# Patient Record
Sex: Female | Born: 1990 | Race: White | Hispanic: No | Marital: Single | Smoking: Current every day smoker
Health system: Southern US, Community
[De-identification: ages and names within clinical notes are randomized; demographics above are authoritative.]

## PROBLEM LIST (undated history)

## (undated) DIAGNOSIS — R51 Headache: Secondary | ICD-10-CM

## (undated) DIAGNOSIS — R519 Headache, unspecified: Secondary | ICD-10-CM

## (undated) HISTORY — DX: Headache, unspecified: R51.9

## (undated) HISTORY — PX: NO PAST SURGERIES: SHX2092

## (undated) HISTORY — DX: Headache: R51

---

## 2009-08-13 ENCOUNTER — Emergency Department: Payer: Self-pay | Admitting: Emergency Medicine

## 2010-03-17 ENCOUNTER — Emergency Department: Payer: Self-pay | Admitting: Emergency Medicine

## 2013-03-05 ENCOUNTER — Emergency Department: Payer: Self-pay | Admitting: Emergency Medicine

## 2015-12-31 ENCOUNTER — Ambulatory Visit (INDEPENDENT_AMBULATORY_CARE_PROVIDER_SITE_OTHER): Payer: BLUE CROSS/BLUE SHIELD | Admitting: Family Medicine

## 2015-12-31 ENCOUNTER — Encounter: Payer: Self-pay | Admitting: Family Medicine

## 2015-12-31 VITALS — BP 98/64 | HR 99 | Temp 98.2°F | Ht <= 58 in | Wt 90.8 lb

## 2015-12-31 DIAGNOSIS — R42 Dizziness and giddiness: Secondary | ICD-10-CM

## 2015-12-31 DIAGNOSIS — Z13220 Encounter for screening for lipoid disorders: Secondary | ICD-10-CM

## 2015-12-31 MED ORDER — HYDROCHLOROTHIAZIDE 12.5 MG PO CAPS: 12.5000 mg | ORAL_CAPSULE | Freq: Every day | ORAL | Status: DC

## 2015-12-31 NOTE — Progress Notes (Signed)
Pre visit review using our clinic review tool, if applicable. No additional management support is needed unless otherwise documented below in the visit note. 

## 2015-12-31 NOTE — Patient Instructions (Signed)
Take the medication as prescribed.  Follow up in 2 weeks (or sooner if needed).  Take care  Dr. Adriana Simasook   Meniere Disease Meniere disease is an inner ear disorder. It causes attacks of a spinning sensation (vertigo) and ringing in the ear (tinnitus). It also causes hearing loss and a sensation of fullness or pressure in your ear.  Meniere disease is lifelong. It may get worse over time. Symptoms usually begin in one ear but may eventually affect both ears.  CAUSES Meniere disease is caused by having too much of the fluid that is in your inner ear (endolymph). When endolymph builds up in your inner ear, it affects the nerves that control balance and hearing. The reason for the endolymph buildup is not known. Possible causes include:  Allergy.  An abnormal reaction of the body's defense system (autoimmune disease).  Viral infection of the inner ear.  Head injury. RISK FACTORS  Age older than 40 years.  Family history of Meniere disease.  History of autoimmune disease.  History of migraine headaches. SIGNS AND SYMPTOMS Symptoms of Meniere disease can come and go and may last for up to 4 hours at a time. Symptoms usually start in one ear and may become more frequent and eventually involve both ears. Symptoms can include:  Fullness and pressure in your ear.  Roaring or ringing in your ear.  Vertigo and loss of balance.  Decreased hearing.  Nausea and vomiting. DIAGNOSIS Your health care provider will perform a physical exam. Tests may be done to confirm a diagnosis of Meniere disease. These tests may include:  A hearing test (audiogram).  An electronystagmogram. This tests your balance nerve (vestibular nerve).  Imaging studies, such as CT or MRI, of your inner ear. TREATMENT There is no cure for Meniere disease, but it can be managed. Management may include:  A diet that may help relieve symptoms of Meniere disease.  Use of medicines to  reduce:  Vertigo.  Nausea.  Fluid retention.  Use of an air pressure pulse generator. This is a machine that sends small pressure pulses into your ear canal.  Inner ear surgery (rare). When you experience symptoms, it can be helpful to lie down on a flat surface and focus your eyes on one object that does not move. Try to stay in that position until your symptoms go away.  HOME CARE INSTRUCTIONS   Take medicines only as directed by your health care provider.  Eat the same amount of food at the same time every day, including snacks.  Do not skip meals.  Limit the salt in your diet to 1,000 mg a day.  Avoid caffeine.  Limit alcoholic drinks to one drink a day.  Do not eat foods containing monosodium glutamate (MSG).  Drink enough fluids to keep your urine clear or pale yellow.  Do not use any tobacco products including cigarettes, chewing tobacco, or electronic cigarettes. If you need help quitting, ask your health care provider.  Find ways to reduce or avoid stress. SEEK MEDICAL CARE IF:   You have symptoms that last longer than 4 hours.  You have new or more severe symptoms. SEEK IMMEDIATE MEDICAL CARE IF:   You have been vomiting for 24 hours.  You are not able to keep fluids down.  You have chest pain or trouble breathing.   This information is not intended to replace advice given to you by your health care provider. Make sure you discuss any questions you have with your health  care provider.   Document Released: 06/11/2000 Document Revised: 07/05/2014 Document Reviewed: 05/28/2013 Elsevier Interactive Patient Education [employer]2016 Elsevier Inc.

## 2016-01-01 ENCOUNTER — Encounter: Payer: Self-pay | Admitting: Family Medicine

## 2016-01-01 DIAGNOSIS — R42 Dizziness and giddiness: Secondary | ICD-10-CM | POA: Insufficient documentation

## 2016-01-01 LAB — LIPID PANEL
CHOLESTEROL: 131 mg/dL (ref 0–200)
HDL: 48.7 mg/dL (ref >39.00)
LDL CALC: 70 mg/dL (ref 0–99)
NonHDL: 82.18
TRIGLYCERIDES: 59 mg/dL (ref 0.0–149.0)
Total CHOL/HDL Ratio: 3
VLDL: 11.8 mg/dL (ref 0.0–40.0)

## 2016-01-01 LAB — COMPREHENSIVE METABOLIC PANEL
ALT: 9 U/L (ref 0–35)
AST: 17 U/L (ref 0–37)
Albumin: 4.6 g/dL (ref 3.5–5.2)
Alkaline Phosphatase: 63 U/L (ref 39–117)
BUN: 17 mg/dL (ref 6–23)
CALCIUM: 9.6 mg/dL (ref 8.4–10.5)
CHLORIDE: 106 meq/L (ref 96–112)
CO2: 24 meq/L (ref 19–32)
Creatinine, Ser: 0.75 mg/dL (ref 0.40–1.20)
GFR: 99.06 mL/min (ref >60.00)
Glucose, Bld: 85 mg/dL (ref 70–99)
Potassium: 4.2 mEq/L (ref 3.5–5.1)
Sodium: 139 mEq/L (ref 135–145)
Total Bilirubin: 0.6 mg/dL (ref 0.2–1.2)
Total Protein: 7 g/dL (ref 6.0–8.3)

## 2016-01-01 LAB — CBC
HEMATOCRIT: 41.1 % (ref 36.0–46.0)
HEMOGLOBIN: 13.8 g/dL (ref 12.0–15.0)
MCHC: 33.5 g/dL (ref 30.0–36.0)
MCV: 96 fl (ref 78.0–100.0)
PLATELETS: 402 10*3/uL — AB (ref 150.0–400.0)
RBC: 4.28 Mil/uL (ref 3.87–5.11)
RDW: 12.4 % (ref 11.5–15.5)
WBC: 8.4 10*3/uL (ref 4.0–10.5)

## 2016-01-01 LAB — TSH: TSH: 1.21 u[IU]/mL (ref 0.35–4.50)

## 2016-01-01 NOTE — Assessment & Plan Note (Signed)
New problem. History consistent with Meniere's. Treating with HCTZ. Patient cautioned as her BP is already on the low side. Follow up in 2 weeks.  If no improvement will send to ENT.

## 2016-01-01 NOTE — Progress Notes (Signed)
Subjective:  Patient ID: Rebecca Benson, female    DOB: [birth year]  Age: 25 y.o. MRN: [id number]  CC: ? Vertigo  HPI Rebecca Benson is a 25 y.o. female presents to the clinic today as a new patient with the above complaint.  Vertigo  Patient reports she has had dizziness for 2-3 years.  She states that she has seen 2 providers previously for this and was told she had vertigo.  She states that her dizziness happens in the mid to end of the day.  She describes it as feeling off balance/unsteady.  Reports spinning.  Lasts for 30-45 minutes then resolved spontaneously.   She reports that it happens frequently.  She reports associated ear fullness and tinnitus.  She has been treated with meclizine and scopolamine without significant improvement.  PMH, Surgical Hx, Family Hx, Social History reviewed and updated as below.  Past Medical History  Diagnosis Date  . Headache    Past Surgical History  Procedure Laterality Date  . No past surgeries     Family History  Problem Relation Age of Onset  . Hypertension Maternal Grandmother     Social History  Substance Use Topics  . Smoking status: Current Every Day Smoker -- 1.00 packs/day    Types: Cigarettes  . Smokeless tobacco: Never Used  . Alcohol Use: No   Review of Systems  HENT: Positive for tinnitus.   Respiratory: Positive for cough.   Neurological: Positive for dizziness and headaches.  Psychiatric/Behavioral: The patient is nervous/anxious.        Stress.  All other systems reviewed and are negative.  Objective:   Today's Vitals: BP 98/64 mmHg  Pulse 99  Temp(Src) 98.2 F (36.8 C) (Oral)  Ht 4' 8.75" (1.441 m)  Wt 90 lb 12 oz (41.164 kg)  BMI 19.82 kg/m2  SpO2 99%  Physical Exam  Constitutional: She is oriented to person, place, and time.  Thin petite female in NAD.   HENT:  Head: Normocephalic and atraumatic.  Mouth/Throat: Oropharynx is clear and moist.  Eyes: Conjunctivae are  normal. No scleral icterus.  Neck: Neck supple.  Cardiovascular: Normal rate and regular rhythm.   Pulmonary/Chest: Effort normal. She has no wheezes. She has no rales.  Abdominal: Soft. She exhibits no distension. There is no tenderness. There is no rebound and no guarding.  Musculoskeletal: Normal range of motion. She exhibits no edema.  Neurological: She is alert and oriented to person, place, and time.  Skin: Skin is warm and dry. No rash noted.  Psychiatric: She has a normal mood and affect.  Vitals reviewed.  Assessment & Plan:   Problem List Items Addressed This Visit    Vertigo - Primary    New problem. History consistent with Meniere's. Treating with HCTZ. Patient cautioned as her BP is already on the low side. Follow up in 2 weeks.  If no improvement will send to ENT.       Relevant Orders   CBC (Completed)   Comp Met (CMET) (Completed)   TSH (Completed)    Other Visit Diagnoses    Screening, lipid        Relevant Orders    Lipid Profile (Completed)       Outpatient Encounter Prescriptions as of 12/31/2015  Medication Sig  . [DISCONTINUED] ibuprofen (ADVIL,MOTRIN) 200 MG tablet Take 200 mg by mouth every 6 (six) hours as needed.  . hydrochlorothiazide (MICROZIDE) 12.5 MG capsule Take 1 capsule (12.5 mg total) by mouth daily.  No facility-administered encounter medications on file as of 12/31/2015.    Follow-up: 2 weeks.  Rebecca Benson

## 2016-01-05 ENCOUNTER — Telehealth: Payer: Self-pay | Admitting: *Deleted

## 2016-01-05 ENCOUNTER — Ambulatory Visit: Payer: BLUE CROSS/BLUE SHIELD | Admitting: Family Medicine

## 2016-01-05 NOTE — Telephone Encounter (Signed)
Called patient and reviewed labs, labs were sent via mail last week to patient.

## 2016-01-05 NOTE — Telephone Encounter (Signed)
Pt has requested lab results from 12-31-15 Pt contact [phone]

## 2016-01-08 ENCOUNTER — Ambulatory Visit (INDEPENDENT_AMBULATORY_CARE_PROVIDER_SITE_OTHER): Payer: BLUE CROSS/BLUE SHIELD | Admitting: Family Medicine

## 2016-01-08 ENCOUNTER — Encounter: Payer: Self-pay | Admitting: Family Medicine

## 2016-01-08 DIAGNOSIS — M545 Low back pain: Secondary | ICD-10-CM

## 2016-01-08 DIAGNOSIS — R109 Unspecified abdominal pain: Secondary | ICD-10-CM

## 2016-01-08 LAB — POCT URINALYSIS DIPSTICK
BILIRUBIN UA: NEGATIVE
Blood, UA: NEGATIVE
Glucose, UA: NEGATIVE
KETONES UA: NEGATIVE
LEUKOCYTES UA: NEGATIVE
Nitrite, UA: NEGATIVE
Spec Grav, UA: 1.015
Urobilinogen, UA: 2
pH, UA: 6.5

## 2016-01-08 NOTE — Progress Notes (Signed)
Pre visit review using our clinic review tool, if applicable. No additional management support is needed unless otherwise documented below in the visit note. 

## 2016-01-08 NOTE — Patient Instructions (Signed)
If this persists we will do the US.  Use OTC Tylenol/ibuprofen for pain/discomfort.  Call with concerns.  Take care  Dr. Adriana Simasook

## 2016-01-09 DIAGNOSIS — R109 Unspecified abdominal pain: Secondary | ICD-10-CM | POA: Insufficient documentation

## 2016-01-09 LAB — CBC
HEMATOCRIT: 42.6 % (ref 36.0–46.0)
HEMOGLOBIN: 14.5 g/dL (ref 12.0–15.0)
MCHC: 34.1 g/dL (ref 30.0–36.0)
MCV: 95.6 fl (ref 78.0–100.0)
PLATELETS: 433 10*3/uL — AB (ref 150.0–400.0)
RBC: 4.45 Mil/uL (ref 3.87–5.11)
RDW: 11.9 % (ref 11.5–15.5)
WBC: 10.7 10*3/uL — ABNORMAL HIGH (ref 4.0–10.5)

## 2016-01-09 LAB — COMPREHENSIVE METABOLIC PANEL
ALK PHOS: 58 U/L (ref 39–117)
ALT: 8 U/L (ref 0–35)
AST: 14 U/L (ref 0–37)
Albumin: 4.6 g/dL (ref 3.5–5.2)
BUN: 14 mg/dL (ref 6–23)
CHLORIDE: 103 meq/L (ref 96–112)
CO2: 26 meq/L (ref 19–32)
Calcium: 9.9 mg/dL (ref 8.4–10.5)
Creatinine, Ser: 0.81 mg/dL (ref 0.40–1.20)
GFR: 90.63 mL/min (ref >60.00)
GLUCOSE: 91 mg/dL (ref 70–99)
POTASSIUM: 3.9 meq/L (ref 3.5–5.1)
SODIUM: 139 meq/L (ref 135–145)
Total Bilirubin: 1.5 mg/dL — ABNORMAL HIGH (ref 0.2–1.2)
Total Protein: 7.3 g/dL (ref 6.0–8.3)

## 2016-01-09 NOTE — Assessment & Plan Note (Signed)
New problem. Unclear etiology and prognosis. Patient has lost nearly 4 lbs since our last visit on 7/5.  Vague symptoms and exam unrevealing. CBC and CMP today. May need imaging if persists.

## 2016-01-09 NOTE — Progress Notes (Signed)
Subjective:  Patient ID: Rebecca Benson, female    DOB: Oct 07, 1990  Age: 25 y.o. MRN: [id number]  CC: L flank pain  HPI:  25 year old female presents with the above complaint.  Patient states that she's not been feeling well since our last visit. I started her on HCTZ for concern for many years. She did not tolerate. Yesterday, she developed left flank/left upper quadrant pain. Patient states that the pain was initially severe and has now gotten somewhat better. Initially the pain was sharp and is now a "pressure" sensation. No associated nausea or vomiting. Patient does have poor by mouth intake. No reports of constipation. No known exacerbating or relieving factors. Additionally, patient states that she's felt "shaky". No recent fever or chills. Patient has an upcoming appointment with ENT given her prolonged or vertigo. No other complaints at this time.  Social Hx   Social History   Social History  . Marital Status: Single    Spouse Name: N/A  . Number of Children: N/A  . Years of Education: N/A   Social History Main Topics  . Smoking status: Current Every Day Smoker -- 1.00 packs/day    Types: Cigarettes  . Smokeless tobacco: Never Used  . Alcohol Use: No  . Drug Use: No  . Sexual Activity: Not Asked   Other Topics Concern  . None   Social History Narrative   Review of Systems  Constitutional: Negative for fever.       Has felt "shaky"  Genitourinary: Positive for flank pain. Negative for dysuria, urgency and frequency.   Objective:  BP 108/82 mmHg  Pulse 99  Temp(Src) 98.2 F (36.8 C) (Oral)  Wt 86 lb 7 oz (39.208 kg)  SpO2 98%  BP/Weight 10/28/8880 8/0/0349  Systolic BP 179 98  Diastolic BP 82 64  Wt. (Lbs) 86.44 90.75  BMI 18.88 19.82   Physical Exam  Constitutional: She is oriented to person, place, and time.  Appears fatigued but in no acute distress.  HENT:  Head: Normocephalic and atraumatic.  Eyes: Conjunctivae are normal. No scleral  icterus.  Cardiovascular: Regular rhythm.  Tachycardia present.   Pulmonary/Chest: Effort normal. She has no wheezes. She has no rales.  Abdominal: Soft. She exhibits no distension. There is no rebound and no guarding.  Patient with tenderness posteriorly around the ribs (left sided).   Neurological: She is alert and oriented to person, place, and time.  Vitals reviewed.  Lab Results  Component Value Date   WBC 8.4 12/31/2015   HGB 13.8 12/31/2015   HCT 41.1 12/31/2015   PLT 402.0* 12/31/2015   GLUCOSE 85 12/31/2015   CHOL 131 12/31/2015   TRIG 59.0 12/31/2015   HDL 48.70 12/31/2015   LDLCALC 70 12/31/2015   ALT 9 12/31/2015   AST 17 12/31/2015   NA 139 12/31/2015   K 4.2 12/31/2015   CL 106 12/31/2015   CREATININE 0.75 12/31/2015   BUN 17 12/31/2015   CO2 24 12/31/2015   TSH 1.21 12/31/2015   Results for orders placed or performed in visit on 01/08/16 (from the past 24 hour(s))  POCT Urinalysis Dipstick     Status: Normal   Collection Time: 01/08/16  4:08 PM  Result Value Ref Range   Color, UA orange    Clarity, UA clear    Glucose, UA neg    Bilirubin, UA neg    Ketones, UA neg    Spec Grav, UA 1.015    Blood, UA neg  pH, UA 6.5    Protein, UA 1+    Urobilinogen, UA 2.0    Nitrite, UA neg    Leukocytes, UA Negative Negative   Assessment & Plan:   Problem List Items Addressed This Visit    Flank pain    New problem. Unclear etiology and prognosis. Patient has lost nearly 4 lbs since our last visit on 7/5.  Vague symptoms and exam unrevealing. CBC and CMP today. May need imaging if persists.      Relevant Orders   POCT Urinalysis Dipstick (Completed)   Comp Met (CMET)   CBC     Follow-up: Pending labs and clinical improvement.  [place]

## 2016-01-13 ENCOUNTER — Telehealth: Payer: Self-pay | Admitting: *Deleted

## 2016-01-13 NOTE — Telephone Encounter (Signed)
LVM with info

## 2016-01-13 NOTE — Telephone Encounter (Signed)
Patient should call ENT office.

## 2016-01-13 NOTE — Telephone Encounter (Signed)
Pt questioned if she should go to her referred with ENT appt tomorrow, considering that she already has a appt with ENT the end of the month. She wanted to make sure this was not a new referral .  Pt contact [phone] A message can be left on the voicemail

## 2016-01-14 ENCOUNTER — Other Ambulatory Visit: Payer: Self-pay | Admitting: Family Medicine

## 2016-01-16 ENCOUNTER — Ambulatory Visit: Payer: BLUE CROSS/BLUE SHIELD | Admitting: Family Medicine

## 2016-01-16 DIAGNOSIS — Z0289 Encounter for other administrative examinations: Secondary | ICD-10-CM

## 2016-03-25 ENCOUNTER — Other Ambulatory Visit: Payer: Self-pay | Admitting: Neurology

## 2016-03-25 DIAGNOSIS — G43719 Chronic migraine without aura, intractable, without status migrainosus: Secondary | ICD-10-CM

## 2016-03-25 DIAGNOSIS — R42 Dizziness and giddiness: Secondary | ICD-10-CM

## 2016-04-01 ENCOUNTER — Ambulatory Visit
Admission: RE | Admit: 2016-04-01 | Discharge: 2016-04-01 | Disposition: A | Discharge status: Home / Self Care | Payer: BLUE CROSS/BLUE SHIELD | Source: Ambulatory Visit | Attending: Neurology | Admitting: Neurology

## 2016-04-01 DIAGNOSIS — G43719 Chronic migraine without aura, intractable, without status migrainosus: Secondary | ICD-10-CM | POA: Diagnosis not present

## 2016-04-01 DIAGNOSIS — R42 Dizziness and giddiness: Secondary | ICD-10-CM | POA: Insufficient documentation

## 2016-04-01 MED ORDER — GADOBENATE DIMEGLUMINE 529 MG/ML IV SOLN
10.0000 mL | Freq: Once | INTRAVENOUS | Status: AC | PRN
  Administered 2016-04-01: 7 mL via INTRAVENOUS

## 2016-07-26 ENCOUNTER — Telehealth: Payer: Self-pay | Admitting: Family Medicine

## 2016-07-26 NOTE — Telephone Encounter (Addendum)
Reason for call: chest pain    Symptoms: left breast sore, no numbness, tingling in left arm , denies SOB feeling like elephant sitting on chest, states this has happened before when on menstrual cycle.   On LMP since Saturday.  Duration since Saturday  Medication: Ibuprofen Patient will go to urgent care for evaluation or Er. Needs immediate evaluation today for cardiac workup.

## 2016-07-26 NOTE — Telephone Encounter (Signed)
Pt mom called and stated that pt is on her cycle and she has noticed that when she is on her cycle she has chest pain and no other symptoms. Please advise, thank you.  Call pt @ [phone]

## 2016-07-26 NOTE — Telephone Encounter (Signed)
Left message for patient to call to follow up to see if she seeked evaluation

## 2016-07-27 NOTE — Telephone Encounter (Signed)
Left voice mail message to call. ?

## 2016-08-09 NOTE — Telephone Encounter (Signed)
Patient never returned call  

## 2016-09-22 ENCOUNTER — Telehealth: Payer: Self-pay | Admitting: Family Medicine

## 2016-09-22 NOTE — Telephone Encounter (Signed)
There is no blood testing regarding this, unless she is referring to allergy testing (done by allergy)

## 2016-09-22 NOTE — Telephone Encounter (Signed)
Pt called and stated that she is having some mold remitation done in her house. Pt would like to know if we can run a blood test to see if it has effected her. Please advise, thank you!  Call pt @ [phone]

## 2016-09-23 NOTE — Telephone Encounter (Signed)
Patient advised of below and she will let us know if she [place] us to refer to allergist .  She will call us back and let us know.

## 2016-09-23 NOTE — Telephone Encounter (Signed)
Left message to call.

## 2016-09-27 ENCOUNTER — Other Ambulatory Visit: Payer: Self-pay | Admitting: Family Medicine

## 2016-09-27 DIAGNOSIS — T7840XA Allergy, unspecified, initial encounter: Secondary | ICD-10-CM

## 2016-09-27 NOTE — Telephone Encounter (Signed)
Pt called back dn stated that she would like a referral to Dr. Sampson Goon over at [facility]. Please advise, thank you!  Call pt @ [phone]

## 2016-09-27 NOTE — Telephone Encounter (Signed)
Patient stated that she would like to have a referral to the allergist  Pt contact [phone]

## 2016-10-06 ENCOUNTER — Ambulatory Visit (INDEPENDENT_AMBULATORY_CARE_PROVIDER_SITE_OTHER): Payer: BLUE CROSS/BLUE SHIELD

## 2016-10-06 ENCOUNTER — Encounter: Payer: Self-pay | Admitting: Family Medicine

## 2016-10-06 ENCOUNTER — Ambulatory Visit (INDEPENDENT_AMBULATORY_CARE_PROVIDER_SITE_OTHER): Payer: BLUE CROSS/BLUE SHIELD | Admitting: Family Medicine

## 2016-10-06 VITALS — BP 102/68 | HR 76 | Temp 97.7°F | Wt 83.5 lb

## 2016-10-06 DIAGNOSIS — R251 Tremor, unspecified: Secondary | ICD-10-CM | POA: Diagnosis not present

## 2016-10-06 DIAGNOSIS — R05 Cough: Secondary | ICD-10-CM | POA: Diagnosis not present

## 2016-10-06 DIAGNOSIS — R634 Abnormal weight loss: Secondary | ICD-10-CM

## 2016-10-06 DIAGNOSIS — N63 Unspecified lump in unspecified breast: Secondary | ICD-10-CM | POA: Diagnosis not present

## 2016-10-06 DIAGNOSIS — G43909 Migraine, unspecified, not intractable, without status migrainosus: Secondary | ICD-10-CM | POA: Insufficient documentation

## 2016-10-06 DIAGNOSIS — E538 Deficiency of other specified B group vitamins: Secondary | ICD-10-CM | POA: Insufficient documentation

## 2016-10-06 DIAGNOSIS — R059 Cough, unspecified: Secondary | ICD-10-CM

## 2016-10-06 LAB — CBC WITH DIFFERENTIAL/PLATELET
BASOS PCT: 0.6 % (ref 0.0–3.0)
Basophils Absolute: 0.1 10*3/uL (ref 0.0–0.1)
EOS ABS: 0.1 10*3/uL (ref 0.0–0.7)
Eosinophils Relative: 1.3 % (ref 0.0–5.0)
HCT: 44.2 % (ref 36.0–46.0)
HEMOGLOBIN: 14.8 g/dL (ref 12.0–15.0)
LYMPHS ABS: 2.1 10*3/uL (ref 0.7–4.0)
Lymphocytes Relative: 23.1 % (ref 12.0–46.0)
MCHC: 33.5 g/dL (ref 30.0–36.0)
MCV: 96.8 fl (ref 78.0–100.0)
MONO ABS: 0.9 10*3/uL (ref 0.1–1.0)
Monocytes Relative: 9.9 % (ref 3.0–12.0)
NEUTROS PCT: 65.1 % (ref 43.0–77.0)
Neutro Abs: 5.9 10*3/uL (ref 1.4–7.7)
PLATELETS: 430 10*3/uL — AB (ref 150.0–400.0)
RBC: 4.57 Mil/uL (ref 3.87–5.11)
RDW: 13.3 % (ref 11.5–15.5)
WBC: 9.1 10*3/uL (ref 4.0–10.5)

## 2016-10-06 LAB — TSH: TSH: 2.74 u[IU]/mL (ref 0.35–4.50)

## 2016-10-06 LAB — COMPREHENSIVE METABOLIC PANEL
ALBUMIN: 4.6 g/dL (ref 3.5–5.2)
ALT: 8 U/L (ref 0–35)
AST: 11 U/L (ref 0–37)
Alkaline Phosphatase: 70 U/L (ref 39–117)
BUN: 11 mg/dL (ref 6–23)
CO2: 24 mEq/L (ref 19–32)
Calcium: 9.7 mg/dL (ref 8.4–10.5)
Chloride: 105 mEq/L (ref 96–112)
Creatinine, Ser: 0.74 mg/dL (ref 0.40–1.20)
GFR: 100.02 mL/min (ref >60.00)
Glucose, Bld: 87 mg/dL (ref 70–99)
POTASSIUM: 4 meq/L (ref 3.5–5.1)
SODIUM: 137 meq/L (ref 135–145)
Total Bilirubin: 0.5 mg/dL (ref 0.2–1.2)
Total Protein: 6.9 g/dL (ref 6.0–8.3)

## 2016-10-06 LAB — HIV ANTIBODY (ROUTINE TESTING W REFLEX): HIV 1&2 Ab, 4th Generation: NONREACTIVE

## 2016-10-06 LAB — SEDIMENTATION RATE: Sed Rate: 2 mm/hr (ref 0–20)

## 2016-10-06 NOTE — Progress Notes (Signed)
Subjective:  Patient ID: Rebecca Benson, female    DOB: April 07, 1991  Age: 26 y.o. MRN: [id number]  CC: Multiple complaints  HPI:  26 year old female with a history of vertigo presents with multiple complaints. See below.  Patient states that she does not feel well. She continues to have ongoing vertigo. She has been seen by ENT and neurology. Neurology feels that her vertigo is related to migraine. Additionally, she has been found to have vitamin B12 deficiency. She is receiving supplementation. Her levels are now within normal limits. She states that she continues to have intermittent sharp pain in her ribs. She's had cough, congestion, and tremor. Neurology is following her regarding the migraine, vertigo, and tremor. Additionally, patient states that she has had a lump in her left breast since December. Nontender. She has not had any workup or evaluation regarding this.  Also, patient is experiencing weight loss. Unintentional. She states that she gains and then subsequently loses weight. She is down 7.25 pounds since January 22 2016. She states that she has intermittent periods where she has little appetite and experiences early satiety. He states that she has other periods of time where she "eats everything in sight". She denies vomiting. She does report some nausea. Normal bowel movements. No hematochezia or melena.  Social Hx   Social History   Social History  . Marital status: Single    Spouse name: N/A  . Number of children: N/A  . Years of education: N/A   Social History Main Topics  . Smoking status: Current Every Day Smoker    Packs/day: 1.00    Types: Cigarettes  . Smokeless tobacco: Never Used  . Alcohol use No  . Drug use: No  . Sexual activity: Not Asked   Other Topics Concern  . None   Social History Narrative  . None    Review of Systems  Constitutional: Positive for unexpected weight change.  HENT: Positive for congestion.   Respiratory: Positive for cough  and shortness of breath.   Gastrointestinal: Positive for nausea.  Neurological: Positive for dizziness and headaches.   Objective:  BP 102/68   Pulse 76   Temp 97.7 F (36.5 C) (Oral)   Wt 83 lb 8 oz (37.9 kg)   SpO2 98%   BMI 18.23 kg/m   BP/Weight 10/06/2016 01/08/2016 12/31/2015  Systolic BP 102 108 98  Diastolic BP 68 82 64  Wt. (Lbs) 83.5 86.44 90.75  BMI 18.23 18.88 19.82   Physical Exam  Constitutional: She is oriented to person, place, and time.  Thin female in no acute distress.  Cardiovascular: Normal rate.   Pulmonary/Chest: Effort normal and breath sounds normal.  Abdominal: Soft. She exhibits no distension. There is no tenderness.  Neurological: She is alert and oriented to person, place, and time.  Psychiatric: She has a normal mood and affect.  Vitals reviewed. Left breast - small, firm palpable mass at the 6:00 position 3 cm from the nipple. Nontender.  Lab Results  Component Value Date   WBC 10.7 (H) 01/08/2016   HGB 14.5 01/08/2016   HCT 42.6 01/08/2016   PLT 433.0 (H) 01/08/2016   GLUCOSE 91 01/08/2016   CHOL 131 12/31/2015   TRIG 59.0 12/31/2015   HDL 48.70 12/31/2015   LDLCALC 70 12/31/2015   ALT 8 01/08/2016   AST 14 01/08/2016   NA 139 01/08/2016   K 3.9 01/08/2016   CL 103 01/08/2016   CREATININE 0.81 01/08/2016   BUN 14 01/08/2016  CO2 26 01/08/2016   TSH 1.21 12/31/2015    Assessment & Plan:   Problem List Items Addressed This Visit    Unexplained weight loss - Primary    New Problem. Uncertain etiology and prognosis at this time. It is unclear to me the source of her issues regarding PO intake (periods of decreased appetite followed by large intake of food. No reports of binge eating or emesis). Proceeding with laboratory workup and chest x-ray.      Relevant Orders   Comprehensive metabolic panel   TSH   Sedimentation rate   HIV antibody (with reflex)   DG Chest 2 View (Completed)   Allergen Profile, Mold   CBC w/Diff    Tremor    Uncertain etiology. Followed by neurology.      Migraine    Follow by neurology.      Cough    New problem. Patient with reports of cough and subjective shortness of breath. She is concerned about mold causing her symptoms. She is having someone test for mold in her house. Wants to see allergy. Will arrange.      Breast mass in female    New problem. Arranging [place].      Relevant Orders   US BREAST LTD UNI RIGHT INC AXILLA   US BREAST LTD UNI LEFT INC AXILLA     Follow-up: 1 month  Rockelle Heuerman DO [facility]

## 2016-10-06 NOTE — Assessment & Plan Note (Addendum)
New Problem. Uncertain etiology and prognosis at this time. It is unclear to me the source of her issues regarding PO intake (periods of decreased appetite followed by large intake of food. No reports of binge eating or emesis). Proceeding with laboratory workup and chest x-ray.

## 2016-10-06 NOTE — Progress Notes (Signed)
Pre visit review using our clinic review tool, if applicable. No additional management support is needed unless otherwise documented below in the visit note. 

## 2016-10-06 NOTE — Patient Instructions (Signed)
Labs and xray today.  We will arrange the referral and the imaging.  Follow up in 1 month.  Take care  Dr. Adriana Simas

## 2016-10-06 NOTE — Assessment & Plan Note (Signed)
Follow by neurology.  

## 2016-10-06 NOTE — Assessment & Plan Note (Signed)
New problem. Patient with reports of cough and subjective shortness of breath. She is concerned about mold causing her symptoms. She is having someone test for mold in her house. Wants to see allergy. Will arrange.

## 2016-10-06 NOTE — Assessment & Plan Note (Signed)
Uncertain etiology. Followed by neurology.

## 2016-10-06 NOTE — Assessment & Plan Note (Signed)
New problem. Arranging [place].

## 2016-10-07 LAB — ALLERGEN PROFILE, MOLD
Allergen, A. alternata, m6: <0.1 kU/L
Allergen, C. Herbarum, M2: <0.1 kU/L
Allergen, P. notatum, m1: <0.1 kU/L

## 2016-10-18 ENCOUNTER — Ambulatory Visit: Payer: BLUE CROSS/BLUE SHIELD | Attending: Family Medicine

## 2016-11-08 ENCOUNTER — Ambulatory Visit: Payer: BLUE CROSS/BLUE SHIELD | Admitting: Family Medicine

## 2016-11-08 DIAGNOSIS — Z0289 Encounter for other administrative examinations: Secondary | ICD-10-CM

## 2016-11-09 ENCOUNTER — Other Ambulatory Visit: Payer: BLUE CROSS/BLUE SHIELD

## 2018-10-04 ENCOUNTER — Ambulatory Visit
Admission: EM | Admit: 2018-10-04 | Discharge: 2018-10-04 | Disposition: A | Discharge status: Home / Self Care | Payer: BLUE CROSS/BLUE SHIELD | Attending: Family Medicine | Admitting: Family Medicine

## 2018-10-04 ENCOUNTER — Other Ambulatory Visit: Payer: Self-pay

## 2018-10-04 ENCOUNTER — Encounter: Payer: Self-pay | Admitting: Emergency Medicine

## 2018-10-04 DIAGNOSIS — G44209 Tension-type headache, unspecified, not intractable: Secondary | ICD-10-CM

## 2018-10-04 MED ORDER — HYDROCODONE-ACETAMINOPHEN 5-325 MG PO TABS: ORAL_TABLET | ORAL | 0 refills | Status: AC

## 2018-10-04 NOTE — ED Triage Notes (Signed)
Pt c/o headache that feels like pressure on the sides of her head. Started about 4 days ago. She also has had tingling in her left thumb. She states that the pressure in her head was worse last night that ibuprofen did not help. Denies head trauma, blurred vision, or weakness.

## 2018-10-28 NOTE — ED Provider Notes (Signed)
[facility]    CSN: [id number] Arrival date & time: 10/04/18  0930     History   Chief Complaint Chief Complaint  Patient presents with  . Headache  . Numbness    HPI BILEN GOLE is a 28 y.o. female.   28 yo female with a c/o headache all around her head like a tight band for the past 4 days. Denies any vision changes, nausea, vomiting, photophobia, fevers, chills, injuries, falls.   The history is provided by the patient.  Headache    Past Medical History:  Diagnosis Date  . Headache     Patient Active Problem List   Diagnosis Date Noted  . B12 deficiency 10/06/2016  . Tremor 10/06/2016  . Migraine 10/06/2016  . Unexplained weight loss 10/06/2016  . Breast mass in female 10/06/2016  . Cough 10/06/2016  . Vertigo 01/01/2016    Past Surgical History:  Procedure Laterality Date  . NO PAST SURGERIES      OB History   No obstetric history on file.      Home Medications    Prior to Admission medications   Medication Sig Start Date End Date Taking? Authorizing Provider  HYDROcodone-acetaminophen (NORCO/VICODIN) 5-325 MG tablet 1-2 tablets bid prn 10/04/18   Payton Mccallum, MD    Family History Family History  Problem Relation Age of Onset  . Hypertension Maternal Grandmother   . Healthy Mother   . Healthy Father     Social History Social History   Tobacco Use  . Smoking status: Current Every Day Smoker    Packs/day: 1.00    Types: Cigarettes  . Smokeless tobacco: Never Used  Substance Use Topics  . Alcohol use: No    Alcohol/week: 0.0 standard drinks  . Drug use: No     Allergies   Sulfa antibiotics   Review of Systems Review of Systems  Neurological: Positive for headaches.     Physical Exam Triage Vital Signs ED Triage Vitals  Enc Vitals Group     BP 10/04/18 1002 113/76     Pulse Rate 10/04/18 1002 85     Resp 10/04/18 1002 18     Temp 10/04/18 1002 98.5 F (36.9 C)     Temp Source 10/04/18 1002 Oral      SpO2 10/04/18 1002 100 %     Weight 10/04/18 0959 91 lb (41.3 kg)     Height 10/04/18 0959 4\' 11"  (1.499 m)     Head Circumference --      Peak Flow --      Pain Score 10/04/18 0959 4     Pain Loc --      Pain Edu? --      Excl. in GC? --    No data found.  Updated Vital Signs BP 113/76 (BP Location: Left Arm)   Pulse 85   Temp 98.5 F (36.9 C) (Oral)   Resp 18   Ht 4\' 11"  (1.499 m)   Wt 41.3 kg   LMP 09/20/2018 (Approximate)   SpO2 100%   BMI 18.38 kg/m   Visual Acuity Right Eye Distance:   Left Eye Distance:   Bilateral Distance:    Right Eye Near:   Left Eye Near:    Bilateral Near:     Physical Exam Vitals signs and nursing note reviewed.  Constitutional:      General: She is not in acute distress.    Appearance: She is not toxic-appearing or diaphoretic.  HENT:  Nose: No congestion or rhinorrhea.  Neurological:     General: No focal deficit present.     Mental Status: She is alert and oriented to person, place, and time.     Cranial Nerves: No cranial nerve deficit.     Sensory: No sensory deficit.     Motor: No weakness.     Coordination: Coordination normal.     Gait: Gait normal.     Deep Tendon Reflexes: Reflexes normal.      UC Treatments / Results  Labs (all labs ordered are listed, but only abnormal results are displayed) Labs Reviewed - No data to display  EKG None  Radiology No results found.  Procedures Procedures (including critical care time)  Medications Ordered in UC Medications - No data to display  Initial Impression / Assessment and Plan / UC Course  I have reviewed the triage vital signs and the nursing notes.  Pertinent labs & imaging results that were available during my care of the patient were reviewed by me and considered in my medical decision making (see chart for details).      Final Clinical Impressions(s) / UC Diagnoses   Final diagnoses:  Tension headache    ED Prescriptions    Medication  Sig Dispense Auth. Provider   HYDROcodone-acetaminophen (NORCO/VICODIN) 5-325 MG tablet 1-2 tablets bid prn 8 tablet Payton Mccallumonty, Amaru Burroughs, MD      1. diagnosis reviewed with patient 2. rx as per orders above; reviewed possible side effects, interactions, risks and benefits   3. Follow-up prn if symptoms worsen or don't improve Controlled Substance Prescriptions  Controlled Substance Registry consulted? Not Applicable   Payton Mccallumonty, Fumi Guadron, MD 10/28/18 1726

## 2018-11-04 ENCOUNTER — Emergency Department: Payer: Self-pay

## 2018-11-04 ENCOUNTER — Emergency Department
Admission: EM | Admit: 2018-11-04 | Discharge: 2018-11-04 | Disposition: A | Discharge status: Home / Self Care | Payer: Self-pay | Attending: Emergency Medicine | Admitting: Emergency Medicine

## 2018-11-04 ENCOUNTER — Encounter: Payer: Self-pay | Admitting: Emergency Medicine

## 2018-11-04 ENCOUNTER — Other Ambulatory Visit: Payer: Self-pay

## 2018-11-04 DIAGNOSIS — F17210 Nicotine dependence, cigarettes, uncomplicated: Secondary | ICD-10-CM | POA: Insufficient documentation

## 2018-11-04 DIAGNOSIS — R51 Headache: Secondary | ICD-10-CM | POA: Insufficient documentation

## 2018-11-04 DIAGNOSIS — R519 Headache, unspecified: Secondary | ICD-10-CM

## 2018-11-04 LAB — CBC WITH DIFFERENTIAL/PLATELET
Abs Immature Granulocytes: 0.04 10*3/uL (ref 0.00–0.07)
Basophils Absolute: 0 10*3/uL (ref 0.0–0.1)
Basophils Relative: 0 %
Eosinophils Absolute: 0.1 10*3/uL (ref 0.0–0.5)
Eosinophils Relative: 1 %
HCT: 43.5 % (ref 36.0–46.0)
Hemoglobin: 14.6 g/dL (ref 12.0–15.0)
Immature Granulocytes: 0 %
Lymphocytes Relative: 24 %
Lymphs Abs: 2.3 10*3/uL (ref 0.7–4.0)
MCH: 32.5 pg (ref 26.0–34.0)
MCHC: 33.6 g/dL (ref 30.0–36.0)
MCV: 96.9 fL (ref 80.0–100.0)
Monocytes Absolute: 0.9 10*3/uL (ref 0.1–1.0)
Monocytes Relative: 10 %
Neutro Abs: 6.4 10*3/uL (ref 1.7–7.7)
Neutrophils Relative %: 65 %
Platelets: 420 10*3/uL — ABNORMAL HIGH (ref 150–400)
RBC: 4.49 MIL/uL (ref 3.87–5.11)
RDW: 11.9 % (ref 11.5–15.5)
WBC: 9.8 10*3/uL (ref 4.0–10.5)
nRBC: 0 % (ref 0.0–0.2)

## 2018-11-04 LAB — BASIC METABOLIC PANEL
Anion gap: 5 (ref 5–15)
BUN: 16 mg/dL (ref 6–20)
CO2: 25 mmol/L (ref 22–32)
Calcium: 9.7 mg/dL (ref 8.9–10.3)
Chloride: 107 mmol/L (ref 98–111)
Creatinine, Ser: 0.65 mg/dL (ref 0.44–1.00)
GFR calc Af Amer: >60 mL/min (ref >60)
GFR calc non Af Amer: >60 mL/min (ref >60)
Glucose, Bld: 119 mg/dL — ABNORMAL HIGH (ref 70–99)
Potassium: 4 mmol/L (ref 3.5–5.1)
Sodium: 137 mmol/L (ref 135–145)

## 2018-11-04 LAB — POCT PREGNANCY, URINE: Preg Test, Ur: NEGATIVE

## 2018-11-04 MED ORDER — BUTALBITAL-APAP-CAFFEINE 50-325-40 MG PO TABS: 1.0000 | ORAL_TABLET | Freq: Four times a day (QID) | ORAL | 0 refills | Status: AC | PRN

## 2018-11-04 NOTE — ED Triage Notes (Signed)
Pt to ED via POV, pt states that for the past 2 months she has been having pressure and tingling on both sides of her head. Pt states that she has been referred to neurology but they are not currently accepting new pts. Pt was told that she wanted to have blood work or a scan done that she would need to come to the ER. Pt is in NAD.

## 2018-11-04 NOTE — Discharge Instructions (Addendum)
Please seek medical attention for any high fevers, chest pain, shortness of breath, change in behavior, persistent vomiting, bloody stool or any other new or concerning symptoms.  

## 2018-11-04 NOTE — ED Provider Notes (Signed)
[facility] Emergency Department Provider Note ____________________________________________   I have reviewed the triage vital signs and the nursing notes.   HISTORY  Chief Complaint head pressure   History limited by: Not Limited   HPI Rebecca Benson is a 28 y.o. female who presents to the emergency department today because of concerns for headache.  She describes it as pressure.  Is located in bilateral temple areas.  It has been going on for little over 1 month.  No trauma.  She states that she notices that when she applies pressure it does somewhat ease the discomfort.  She is noticed this when she is wearing sunglasses or lies on that side of her head.  Patient denies any associated vision deficits.  The patient denies any nausea or vomiting.  She does have a history of migraines for which she saw neurology couple of years ago but this feels different.  No fevers.  Records reviewed. Per medical record review patient has a history of headache  Past Medical History:  Diagnosis Date  . Headache     Patient Active Problem List   Diagnosis Date Noted  . B12 deficiency 10/06/2016  . Tremor 10/06/2016  . Migraine 10/06/2016  . Unexplained weight loss 10/06/2016  . Breast mass in female 10/06/2016  . Cough 10/06/2016  . Vertigo 01/01/2016    Past Surgical History:  Procedure Laterality Date  . NO PAST SURGERIES      Prior to Admission medications   Medication Sig Start Date End Date Taking? Authorizing Provider  butalbital-acetaminophen-caffeine (FIORICET) 432062573950-325-40 MG tablet Take 1 tablet by mouth every 6 (six) hours as needed for headache. 11/04/18 11/04/19  Phineas SemenGoodman, Hyder Deman, MD  HYDROcodone-acetaminophen (NORCO/VICODIN) 5-325 MG tablet 1-2 tablets bid prn 10/04/18   Payton Mccallumonty, [place], MD    Allergies Sulfa antibiotics  Family History  Problem Relation Age of Onset  . Hypertension Maternal Grandmother   . Healthy Mother   . Healthy Father      Social History Social History   Tobacco Use  . Smoking status: Current Every Day Smoker    Packs/day: 1.00    Types: Cigarettes  . Smokeless tobacco: Never Used  Substance Use Topics  . Alcohol use: No    Alcohol/week: 0.0 standard drinks  . Drug use: No    Review of Systems Constitutional: No fever/chills Eyes: No visual changes. ENT: No sore throat. Cardiovascular: Denies chest pain. Respiratory: Denies shortness of breath. Gastrointestinal: No abdominal pain.  No nausea, no vomiting.  No diarrhea.   Genitourinary: Negative for dysuria. Musculoskeletal: Negative for back pain. Skin: Negative for rash. Neurological: Positive for headache.   ____________________________________________   PHYSICAL EXAM:  VITAL SIGNS: ED Triage Vitals [11/04/18 1637]  Enc Vitals Group     BP 129/85     Pulse Rate 88     Resp 16     Temp 97.9 F (36.6 C)     Temp Source Oral     SpO2 100 %     Weight 96 lb (43.5 kg)     Height 4\' 10"  (1.473 m)     Head Circumference      Peak Flow      Pain Score 3   Constitutional: Alert and oriented.  Eyes: Conjunctivae are normal.  ENT      Head: Normocephalic and atraumatic.      Nose: No congestion/rhinnorhea.      Mouth/Throat: Mucous membranes are moist.      Neck: No stridor.  Hematological/Lymphatic/Immunilogical: No cervical lymphadenopathy. Cardiovascular: Normal rate, regular rhythm.  No murmurs, rubs, or gallops.  Respiratory: Normal respiratory effort without tachypnea nor retractions. Breath sounds are clear and equal bilaterally. No wheezes/rales/rhonchi. Gastrointestinal: Soft and non tender. No rebound. No guarding.  Genitourinary: Deferred Musculoskeletal: Normal range of motion in all extremities. No lower extremity edema. Neurologic:  Normal speech and language. No gross focal neurologic deficits are appreciated.  Skin:  Skin is warm, dry and intact. No rash noted. Psychiatric: Mood and affect are normal. Speech and  behavior are normal. Patient exhibits appropriate insight and judgment.  ____________________________________________    LABS (pertinent positives/negatives)  Upreg negative BMP wnl except glu 119 CBC wbc 9.8, hgb 14.6, plt 420  ____________________________________________   EKG  None  ____________________________________________    RADIOLOGY  CT head No acute abnormality  ____________________________________________   PROCEDURES  Procedures  ____________________________________________   INITIAL IMPRESSION / ASSESSMENT AND PLAN / ED COURSE  Pertinent labs & imaging results that were available during my care of the patient were reviewed by me and considered in my medical decision making (see chart for details).   Patient presented to the emergency department today because of concerns for headache.  CT head was negative.  Blood work without concerning electrolyte abnormalities no leukocytosis.  At this point I have lower suspicion for meningitis, bleed, mass. Patient did feel better knowing head ct was negative. Will give patient prescription for fioricet. She has telemedicine appointment with neurology next week.   ___________________________________________   FINAL CLINICAL IMPRESSION(S) / ED DIAGNOSES  Final diagnoses:  Headache in front of head     Note: This dictation was prepared with Dragon dictation. Any transcriptional errors that result from this process are unintentional     Phineas Semen, MD 11/04/18 2224

## 2018-11-04 NOTE — ED Notes (Signed)
Pt up to use restroom. Steady.  

## 2018-11-04 NOTE — ED Notes (Addendum)
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 Pressure/tingling in head is new and has numbness over L eyebrow. Pressure from temples to behind ears. Denies changes in vision, hitting head, LOC, or sensitivity to light and sound. When wearing glasses or hat pt states pressure dissipates. Intermittent. Denies nausea, dizziness (outside of normal vertigo experiences), or numbness/lack of coordination to any other part of body. Speech clear. Father has had a few strokes but no other family history. Given prescription Vicodin from [facility] urgent care without relief. Pt states laying on R side hurts worse but laying on the L side relieves some of the head pressure. Sleep patterns haven't changed, drinking water, and eating normally per pt. Pt offered warm blanket but declined.

## 2018-11-04 NOTE — ED Notes (Signed)
Topaz froze so pt signed printed d/c paperwork.

## 2020-07-08 IMAGING — CT CT HEAD WITHOUT CONTRAST
3 series · 16 of 45 positions shown, 19 images · non-contrast
Comparison: MRI brain from 04/01/2016

CLINICAL DATA: Sensation of pressure and tingling along the size
the head. Headache.

EXAM:
CT HEAD WITHOUT CONTRAST
TECHNIQUE: Contiguous axial images were obtained from the base of the skull
through the vertex without intravenous contrast.

[Series 3: head wo · axial · 0.35mm/px · z∈[+367,+482]mm · 10 of 28 slices shown, 13 images]
[im 3/28  brain]
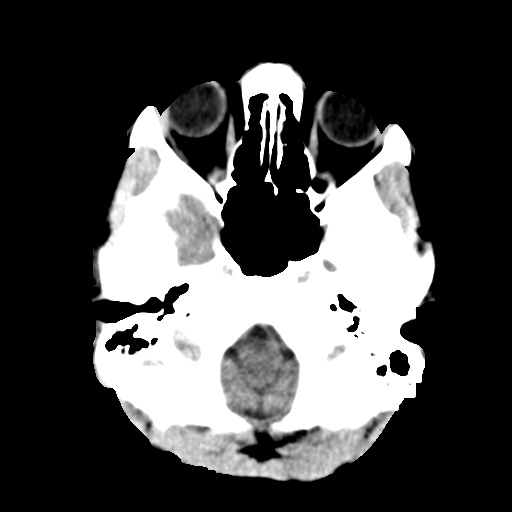
[im 3/28  bone]
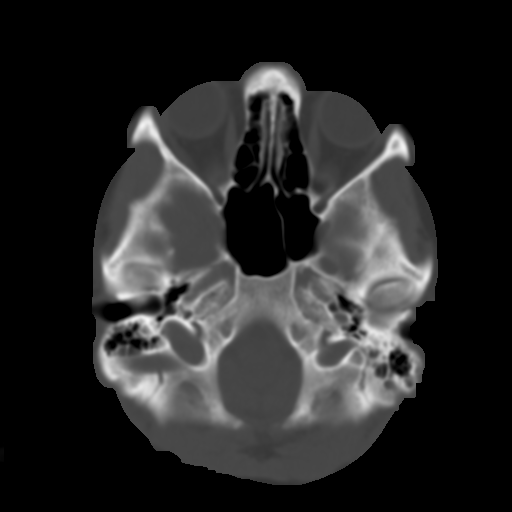
[im 5/28  brain]
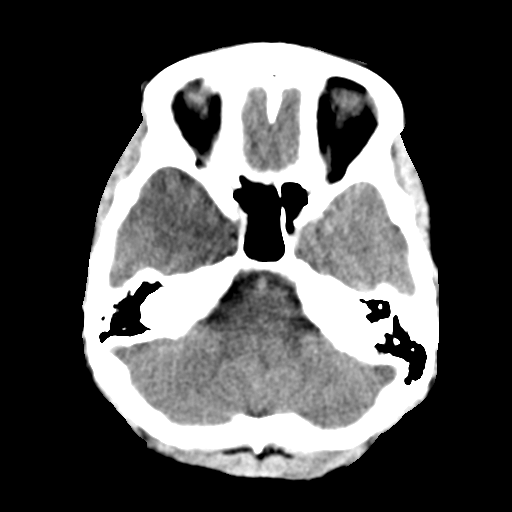
[im 8/28  brain]
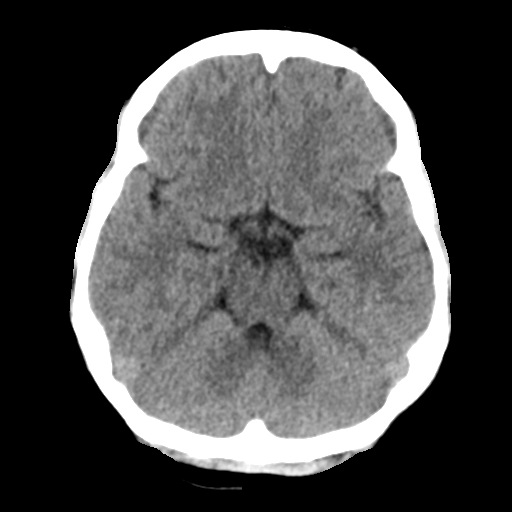
[im 11/28  brain]
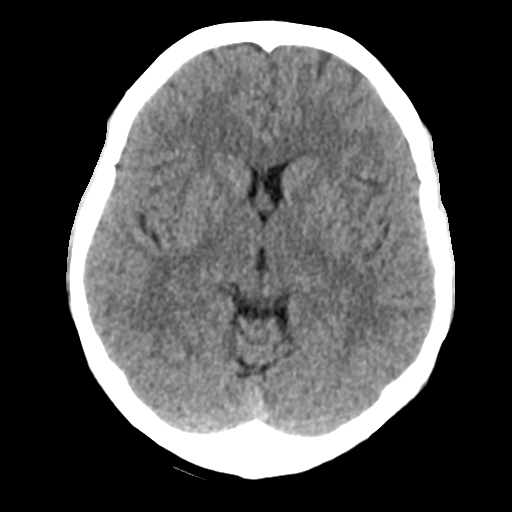
[im 13/28  brain]
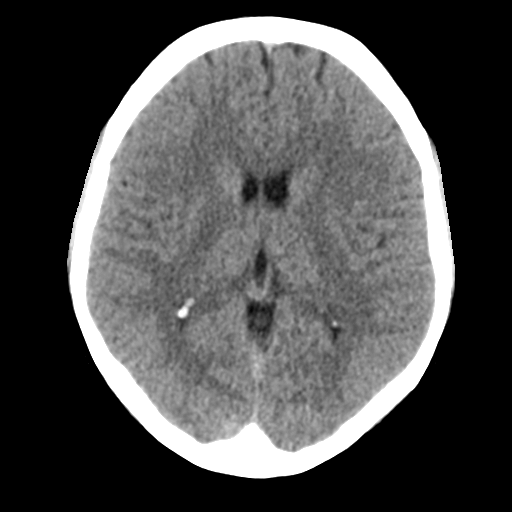
[im 13/28  bone]
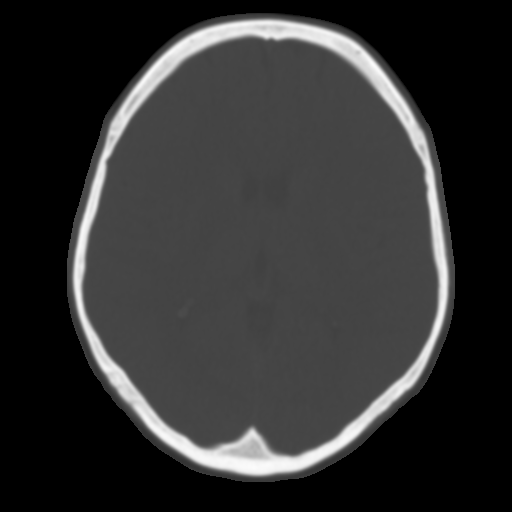
[im 16/28  brain]
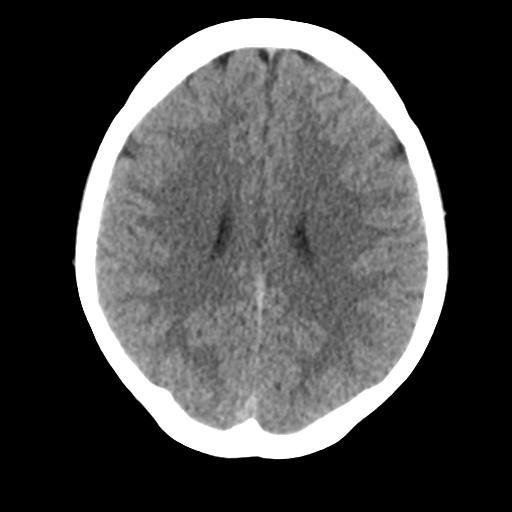
[im 18/28  brain]
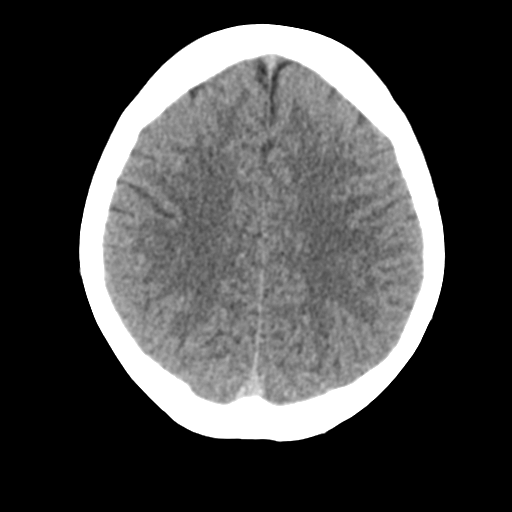
[im 21/28  brain]
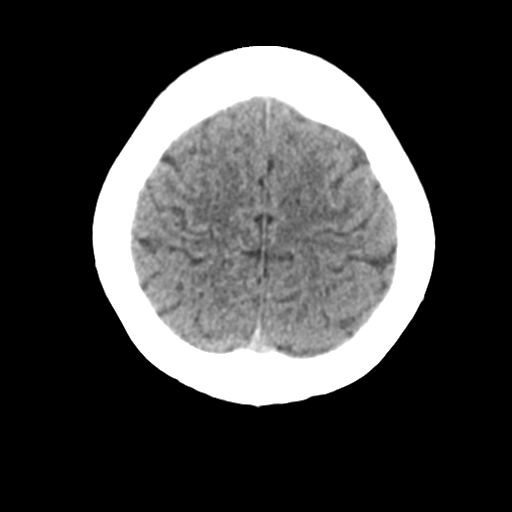
[im 24/28  brain]
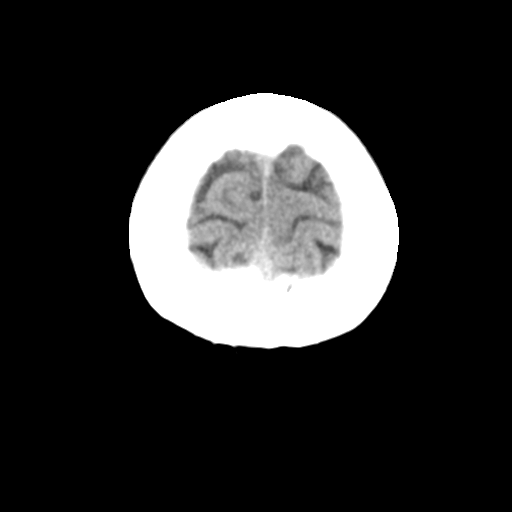
[im 24/28  bone]
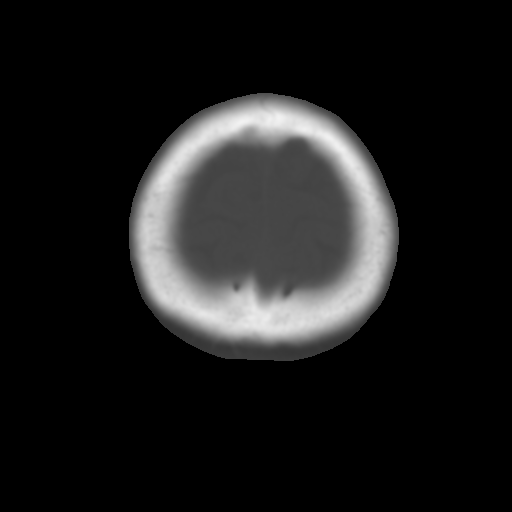
[im 26/28  brain]
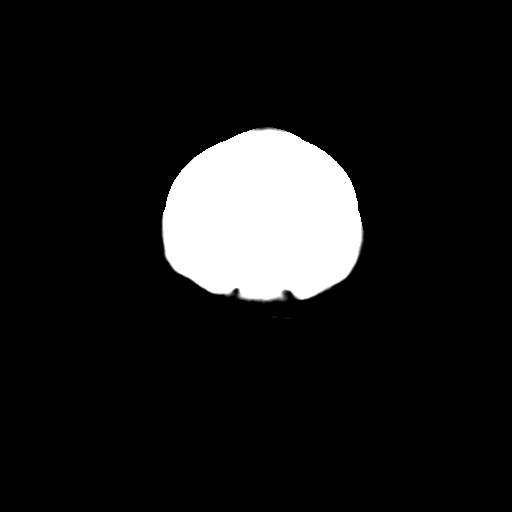

[Series 4: coronal soft tissue · coronal · 0.26mm/px · 3 of 57 slices shown]
[im 19/57  brain]
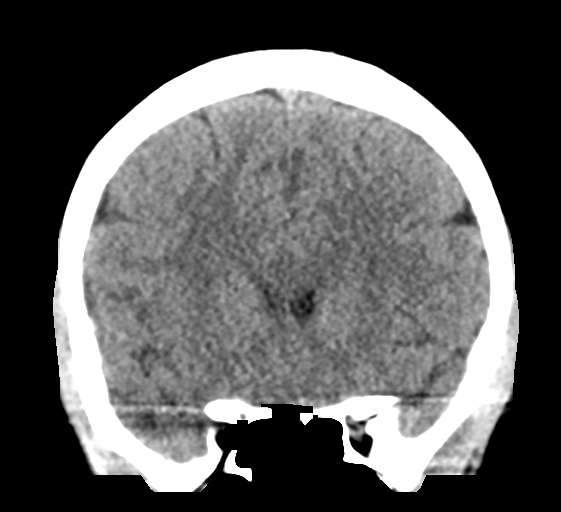
[im 25/57  brain]
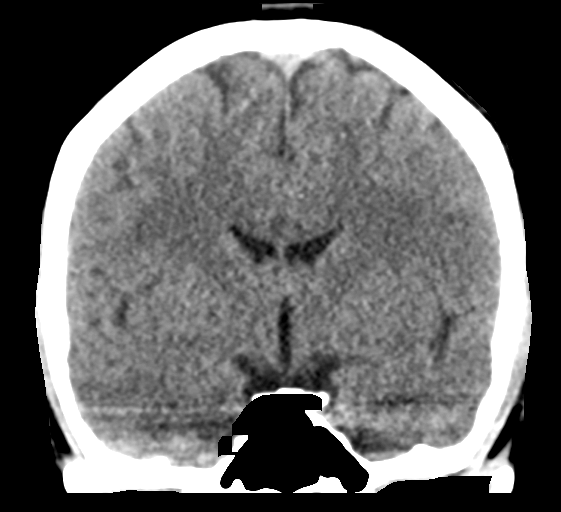
[im 32/57  brain]
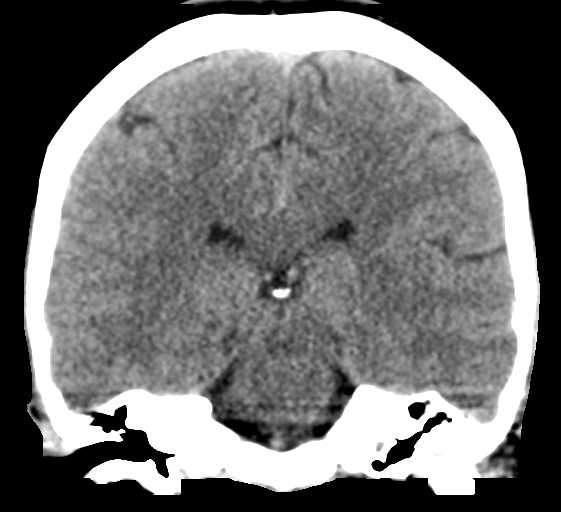

[Series 5: sagittal soft tissue · sagittal · 0.26mm/px · 3 of 50 slices shown]
[im 17/50  brain]
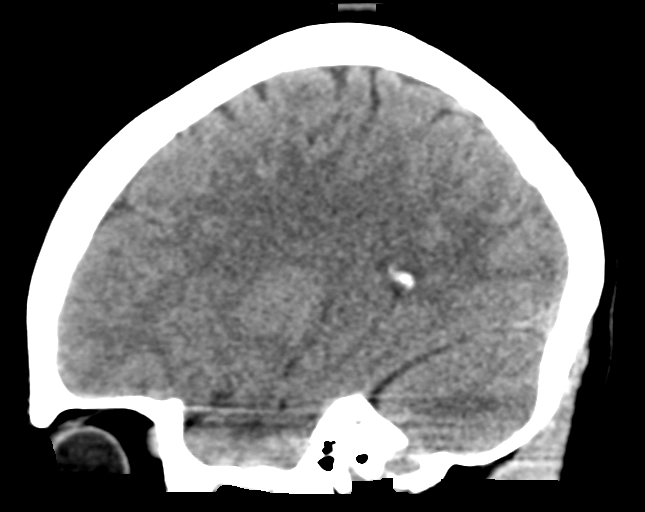
[im 25/50  brain]
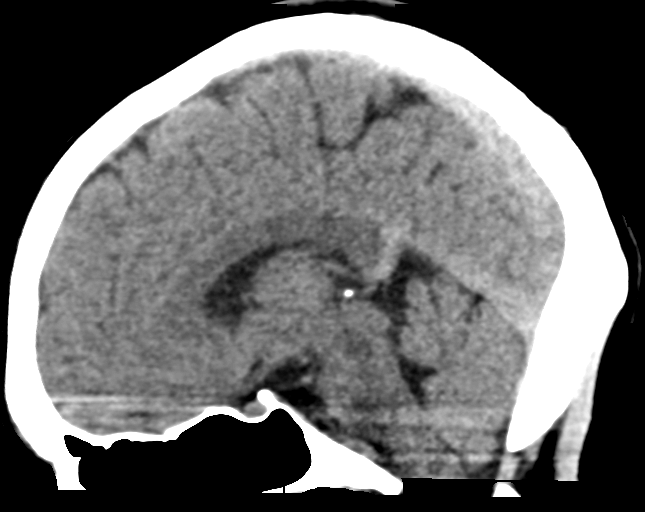
[im 33/50  brain]
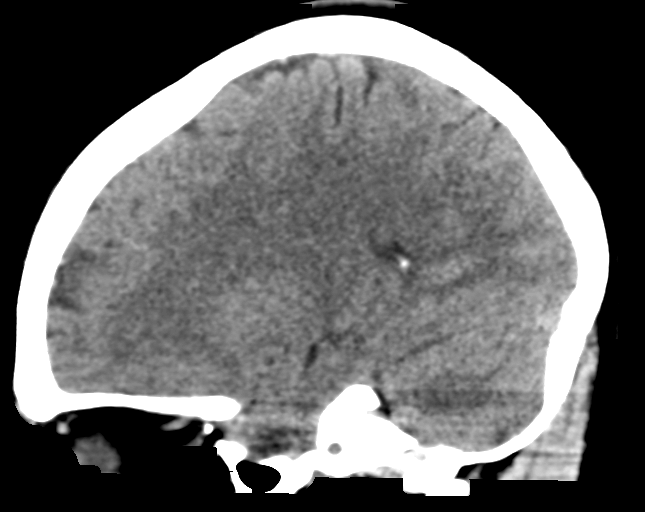

[16 of 45 positions shown; findings below may reference images not displayed]

FINDINGS: Brain: The brainstem, cerebellum, cerebral peduncles, thalami, basal
ganglia, basilar cisterns, and ventricular system appear within
normal limits. No intracranial hemorrhage, mass lesion, or acute
CVA.

Vascular: Unremarkable

Skull: Unremarkable

Sinuses/Orbits: Unremarkable

Other: No supplemental non-categorized findings.
IMPRESSION: No significant abnormality is identified to explain the patient's
symptoms.

## 2023-05-17 ENCOUNTER — Encounter: Payer: Self-pay | Admitting: Emergency Medicine

## 2023-05-17 ENCOUNTER — Other Ambulatory Visit: Payer: Self-pay

## 2023-05-17 ENCOUNTER — Emergency Department: Payer: Self-pay

## 2023-05-17 ENCOUNTER — Emergency Department
Admission: EM | Admit: 2023-05-17 | Discharge: 2023-05-17 | Disposition: A | Discharge status: Home / Self Care | Payer: Self-pay | Attending: Emergency Medicine | Admitting: Emergency Medicine

## 2023-05-17 DIAGNOSIS — R002 Palpitations: Secondary | ICD-10-CM | POA: Insufficient documentation

## 2023-05-17 DIAGNOSIS — E876 Hypokalemia: Secondary | ICD-10-CM | POA: Insufficient documentation

## 2023-05-17 DIAGNOSIS — R079 Chest pain, unspecified: Secondary | ICD-10-CM | POA: Insufficient documentation

## 2023-05-17 DIAGNOSIS — R000 Tachycardia, unspecified: Secondary | ICD-10-CM | POA: Insufficient documentation

## 2023-05-17 DIAGNOSIS — R109 Unspecified abdominal pain: Secondary | ICD-10-CM | POA: Insufficient documentation

## 2023-05-17 DIAGNOSIS — D72829 Elevated white blood cell count, unspecified: Secondary | ICD-10-CM | POA: Insufficient documentation

## 2023-05-17 LAB — CBC
HCT: 41.7 % (ref 36.0–46.0)
Hemoglobin: 14 g/dL (ref 12.0–15.0)
MCH: 30.9 pg (ref 26.0–34.0)
MCHC: 33.6 g/dL (ref 30.0–36.0)
MCV: 92.1 fL (ref 80.0–100.0)
Platelets: 695 10*3/uL — ABNORMAL HIGH (ref 150–400)
RBC: 4.53 MIL/uL (ref 3.87–5.11)
RDW: 13 % (ref 11.5–15.5)
WBC: 21.4 10*3/uL — ABNORMAL HIGH (ref 4.0–10.5)
nRBC: 0 % (ref 0.0–0.2)

## 2023-05-17 LAB — TROPONIN I (HIGH SENSITIVITY): Troponin I (High Sensitivity): 5 ng/L (ref <18)

## 2023-05-17 LAB — URINALYSIS, ROUTINE W REFLEX MICROSCOPIC
Bacteria, UA: NONE SEEN
Bilirubin Urine: NEGATIVE
Glucose, UA: NEGATIVE mg/dL
Ketones, ur: NEGATIVE mg/dL
Leukocytes,Ua: NEGATIVE
Nitrite: NEGATIVE
Protein, ur: NEGATIVE mg/dL
Specific Gravity, Urine: 1 — ABNORMAL LOW (ref 1.005–1.030)
pH: 7 (ref 5.0–8.0)

## 2023-05-17 LAB — MAGNESIUM: Magnesium: 2.1 mg/dL (ref 1.7–2.4)

## 2023-05-17 LAB — BASIC METABOLIC PANEL
Anion gap: 11 (ref 5–15)
BUN: 9 mg/dL (ref 6–20)
CO2: 22 mmol/L (ref 22–32)
Calcium: 9 mg/dL (ref 8.9–10.3)
Chloride: 105 mmol/L (ref 98–111)
Creatinine, Ser: 0.7 mg/dL (ref 0.44–1.00)
GFR, Estimated: >60 mL/min (ref >60)
Glucose, Bld: 120 mg/dL — ABNORMAL HIGH (ref 70–99)
Potassium: 2.8 mmol/L — ABNORMAL LOW (ref 3.5–5.1)
Sodium: 138 mmol/L (ref 135–145)

## 2023-05-17 LAB — POC URINE PREG, ED: Preg Test, Ur: NEGATIVE

## 2023-05-17 MED ORDER — POTASSIUM CHLORIDE CRYS ER 20 MEQ PO TBCR
40.0000 meq | EXTENDED_RELEASE_TABLET | Freq: Once | ORAL | Status: AC
  Administered 2023-05-17: 40 meq via ORAL
  Filled 2023-05-17: qty 2

## 2023-05-17 MED ORDER — IOHEXOL 350 MG/ML SOLN
75.0000 mL | Freq: Once | INTRAVENOUS | Status: AC | PRN
  Administered 2023-05-17: 75 mL via INTRAVENOUS

## 2023-05-17 NOTE — ED Provider Notes (Signed)
Patient received in signout from Dr. Lenard Lance pending CTA chest to evaluate for PE.  Patient presented tachycardic that self resolved and had an opposite leg leukocytosis.  Hypokalemia replaced orally.  CTA chest without acute features and she has no residual discomfort.  She is comfortable going home.  We discussed possible etiologies of pain as well as possible etiologies of her leukocytosis.  Discussed ED return precautions and she is suitable for outpatient management.   Delton Prairie, MD 05/17/23 [phone]

## 2023-05-17 NOTE — ED Triage Notes (Signed)
Patient to ED via Pov for left sided flank pain- today and dizzy spells x2 weeks.  States her HR has also been elevated. Denies CP.

## 2023-05-17 NOTE — ED Notes (Signed)
Pt taken to 18H via w/c by EDT Luther Parody; report called to Bishop Dublin, RN

## 2023-05-17 NOTE — ED Provider Notes (Signed)
[facility] Provider Note    Event Date/Time   First MD Initiated Contact with Patient 05/17/23 2011     (approximate)  History   Chief Complaint: Chest pain  HPI  Rebecca Benson is a 32 y.o. female with no past medical history who presents to the emergency department for left chest pain.  According to the patient around 1:00 today she developed a sudden sharp pain to her left lateral chest followed by tachycardia.  Patient states her heart was racing so she came to the emergency department for evaluation.  While waiting in the emergency department waiting room (patient has been here 6 hours) her heart rate has come down currently around 90 bpm.  Patient states no chest pain currently.  Patient denies any abdominal pain at any point.  Denies any nausea vomiting diarrhea no urinary symptoms.  No birth control or estrogen use.  No history of DVT.  Physical Exam   Triage Vital Signs: ED Triage Vitals  Encounter Vitals Group     BP 05/17/23 1440 (!) 133/95     Systolic BP Percentile --      Diastolic BP Percentile --      Pulse Rate 05/17/23 1440 (!) 129     Resp 05/17/23 1440 18     Temp 05/17/23 1440 98.5 F (36.9 C)     Temp Source 05/17/23 1440 Oral     SpO2 05/17/23 1440 100 %     Weight 05/17/23 1441 125 lb (56.7 kg)     Height 05/17/23 1441 4\' 10"  (1.473 m)     Head Circumference --      Peak Flow --      Pain Score 05/17/23 1441 0     Pain Loc --      Pain Education --      Exclude from Growth Chart --     Most recent vital signs: Vitals:   05/17/23 1440 05/17/23 1826  BP: (!) 133/95 117/80  Pulse: (!) 129 94  Resp: 18 16  Temp: 98.5 F (36.9 C) 98.9 F (37.2 C)  SpO2: 100% 100%    General: Awake, no distress.  CV:  Good peripheral perfusion.  Regular rate and rhythm  Resp:  Normal effort.  Equal breath sounds bilaterally.  No chest wall tenderness. Abd:  No distention.  Soft, nontender.  No rebound or guarding. Other:  No  lower extremity edema.   ED Results / Procedures / Treatments   EKG  EKG viewed and interpreted by myself shows sinus tachycardia at 130 bpm with a narrow QRS, normal axis, normal intervals, no concerning ST changes.  RADIOLOGY  CTA pending   MEDICATIONS ORDERED IN ED: Medications - No data to display   IMPRESSION / MDM / ASSESSMENT AND PLAN / ED COURSE  I reviewed the triage vital signs and the nursing notes.  Patient's presentation is most consistent with acute presentation with potential threat to life or bodily function.  Patient presents to the emergency department for sharp left chest pain followed by tachycardia/heart racing sensation.  Patient states a history of palpitations in the past but states that has never been so significant.  Patient denies ever seeing a heart doctor or using a Holter monitor.  Patient's heart rate currently around 90 bpm.  Patient's lab work has resulted showing reassuring chemistry besides slight hypokalemia.  Patient CBC however shows significant leukocytosis of 21,000 which does appear new.  Magnesium is normal.  Urinalysis is normal.  Pregnancy test is negative.  Patient says she has been coughing for the last 2 to 3 weeks but feels this is improving.  Denies any fever.  Given the sharp chest pain and tachycardia we will obtain a CTA of the chest to rule out PE.  We will also check a troponin as a precaution.  Patient denies any abdominal pain now or at any point.  Completely benign abdominal exam.  Unclear the cause of the patient's leukocytosis denies any steroid or prednisone use.  Possibly stress response.  I do believe the patient will require further workup if the CTA is negative including outpatient PCP follow-up as well as cardiology follow-up for consideration of a Holter monitor.  CTA of the chest is pending.  Patient care signed out to oncoming provider.  FINAL CLINICAL IMPRESSION(S) / ED DIAGNOSES   Chest pain Palpitations   Note:   This document was prepared using Dragon voice recognition software and may include unintentional dictation errors.   Minna Antis, MD 05/21/23 1416
